# Patient Record
Sex: Female | Born: 1995 | Race: White | Hispanic: No | Marital: Single | Smoking: Current every day smoker
Health system: Southern US, Community
[De-identification: ages and names within clinical notes are randomized; demographics above are authoritative.]

---
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
## 2015-08-04 ENCOUNTER — Ambulatory Visit
Admission: EM | Admit: 2015-08-04 | Discharge: 2015-08-04 | Disposition: A | Discharge status: Home / Self Care | Payer: Self-pay | Attending: Emergency Medicine | Admitting: Emergency Medicine

## 2015-08-04 DIAGNOSIS — K0889 Other specified disorders of teeth and supporting structures: Secondary | ICD-10-CM

## 2015-08-04 DIAGNOSIS — K029 Dental caries, unspecified: Secondary | ICD-10-CM

## 2015-08-04 MED ORDER — HYDROCODONE-ACETAMINOPHEN 5-325 MG PO TABS: ORAL_TABLET | ORAL | 0 refills | Status: AC

## 2015-08-04 MED ORDER — PENICILLIN V POTASSIUM 500 MG PO TABS: 500.0000 mg | ORAL_TABLET | Freq: Four times a day (QID) | ORAL | 0 refills | Status: AC

## 2015-08-04 MED ORDER — IBUPROFEN 800 MG PO TABS
800.0000 mg | ORAL_TABLET | Freq: Three times a day (TID) | ORAL | 0 refills | Status: AC
Start: 2015-08-04 — End: ?

## 2015-08-04 MED ORDER — CHLORHEXIDINE GLUCONATE 0.12 % MT SOLN: OROMUCOSAL | 0 refills | Status: AC

## 2015-08-04 NOTE — ED Provider Notes (Signed)
HPI  SUBJECTIVE:  Tracey Lynch is a 20 y.o. female who presents with throbbing, sharp, constant right upper dental pain starting yesterday. She does not recall any trauma to the tooth. She has tried Excedrin with temporary improvement. Symptoms are worse with exposure to air and hot and cold liquids. She denies fevers, facial swelling, ear pain, trismus, foul taste in her mouth. Past medical history negative for diabetes, hyper tension. LMP: 3 days ago, denies possibility of being pregnant. Patient states that she has a plan to see a dentist that she seen before tomorrow.    History reviewed. No pertinent past medical history.  History reviewed. No pertinent surgical history.  History reviewed. No pertinent family history.  Social History  Substance Use Topics  . Smoking status: Current Some Day Smoker  . Smokeless tobacco: Never Used  . Alcohol use No    No current facility-administered medications for this encounter.   Current Outpatient Prescriptions:  .  norgestimate-ethinyl estradiol (ORTHO-CYCLEN,SPRINTEC,PREVIFEM) 0.25-35 MG-MCG tablet, Take 1 tablet by mouth daily., Disp: , Rfl:  .  chlorhexidine (PERIDEX) 0.12 % solution, 15 mL swish and spit bid, Disp: 480 mL, Rfl: 0 .  HYDROcodone-acetaminophen (NORCO/VICODIN) 5-325 MG tablet, 1-2 tabs q 6hr prn pain, Disp: 20 tablet, Rfl: 0 .  ibuprofen (ADVIL,MOTRIN) 800 MG tablet, Take 1 tablet (800 mg total) by mouth 3 (three) times daily., Disp: 30 tablet, Rfl: 0 .  penicillin v potassium (VEETID) 500 MG tablet, Take 1 tablet (500 mg total) by mouth 4 (four) times daily. X 10 days, Disp: 40 tablet, Rfl: 0  No Known Allergies   ROS  As noted in HPI.   Physical Exam  BP 112/85 (BP Location: Left Arm)   Pulse 79   Temp 98.6 F (37 C) (Oral)   Resp 20   Ht 5\' 5"  (1.651 m)   Wt 185 lb (83.9 kg)   LMP 07/29/2015   BMI 30.79 kg/m   Constitutional: Well developed, well nourished, no acute distress Eyes:  EOMI,  conjunctiva normal bilaterally HENT: Normocephalic, atraumatic,mucus membranes moist. Tooth #3, right upper first molar extensively decayed, tender to palpation. Positive gingival tenderness, no gingival swelling. No expressible purulent drainage. No trismus. Neck: No neck stiffness Respiratory: Normal inspiratory effort Cardiovascular: Normal rate GI: nondistended skin: No rash, skin intact Musculoskeletal: no deformities Neurologic: Alert & oriented x 3, no focal neuro deficits Psychiatric: Speech and behavior appropriate   ED Course   Medications - No data to display  No orders of the defined types were placed in this encounter.   No results found for this or any previous visit (from the past 24 hour(s)). No results found.  ED Clinical Impression  Dental caries  Toothache   ED Assessment/Plan  [facility] narcotic database reviewed. No appendectomy prescriptions in the past 6 months.   Procedure note: Perform dental block with 0.5 cc of lidocaine with epinephrine 1% with complete relief of symptoms. Patient tolerated procedure well.  Patient to follow-up with dentist of choice tomorrow. Home with Norco, ibuprofen, penicillin, Peridex or Listerine. Patient to discontinue the Excedrin. Discussed  MDM, plan and followup with patient. Patient agrees with plan.   *This clinic note was created using Dragon dictation software. Therefore, there may be occasional mistakes despite careful proofreading.  ?   Domenick Gong, MD 08/04/15 [phone]

## 2016-05-29 ENCOUNTER — Encounter: Payer: Self-pay | Admitting: *Deleted

## 2016-05-29 ENCOUNTER — Ambulatory Visit
Admission: EM | Admit: 2016-05-29 | Discharge: 2016-05-29 | Disposition: A | Discharge status: Home / Self Care | Payer: 59 | Attending: Internal Medicine | Admitting: Internal Medicine

## 2016-05-29 DIAGNOSIS — N6011 Diffuse cystic mastopathy of right breast: Secondary | ICD-10-CM

## 2016-05-29 DIAGNOSIS — L539 Erythematous condition, unspecified: Secondary | ICD-10-CM | POA: Diagnosis not present

## 2016-05-29 MED ORDER — CEPHALEXIN 500 MG PO CAPS: 500.0000 mg | ORAL_CAPSULE | Freq: Two times a day (BID) | ORAL | 0 refills | Status: AC

## 2016-05-29 NOTE — ED Triage Notes (Signed)
Patient noticed a red irritated spot on her right breast yesterday. Patient does have a history of breast cyst.

## 2016-05-29 NOTE — ED Provider Notes (Signed)
CSN: [id number]     Arrival date & time 05/29/16  1614 History   First MD Initiated Contact with Patient 05/29/16 1659     Chief Complaint  Patient presents with  . Skin Discoloration   (Consider location/radiation/quality/duration/timing/severity/associated sxs/prior Treatment) Patient is a 21 year old female who presents with complaint of a hard area of redness and pain to her right breast. Patient states her significant other noticed the redness yesterday but states that pain was noticeable this morning. Patient states she was told during a exam with the health department when she got her birth control pills that she had cystic breasts, which she states her mother also has. Patient states she has no knowledge of any family members with breast cancer. Patient states this pain redness is not typical for her menstrual breast pain. Patient states her last menstrual period was around 23rd-24th of April. Patient denies any discharge from the breast or from the area of redness. Patient states that she has had her nipple pierced for quite some time and has never had issues with that and doesn't believe this is associated with it. Patient has not taken any medications for this. Patient denies any fever or chills.      History reviewed. No pertinent past medical history. History reviewed. No pertinent surgical history. Family History  Problem Relation Age of Onset  . Cancer Mother   . Heart attack Father    Social History  Substance Use Topics  . Smoking status: Current Every Day Smoker    Packs/day: 0.50    Types: Cigarettes  . Smokeless tobacco: Never Used  . Alcohol use No   OB History    No data available     Review of Systems  Skin:       Painful area of erythema to right breast.  Chest: History of cystic breast  Allergies  Patient has no known allergies.  Home Medications   Prior to Admission medications   Medication Sig Start Date End Date Taking? Authorizing Provider    cephALEXin (KEFLEX) 500 MG capsule Take 1 capsule (500 mg total) by mouth 2 (two) times daily. 05/29/16 06/05/16  Candis Schatz, PA-C  chlorhexidine (PERIDEX) 0.12 % solution 15 mL swish and spit bid 08/04/15   Domenick Gong, MD  HYDROcodone-acetaminophen (NORCO/VICODIN) 5-325 MG tablet 1-2 tabs q 6hr prn pain 08/04/15   Domenick Gong, MD  ibuprofen (ADVIL,MOTRIN) 800 MG tablet Take 1 tablet (800 mg total) by mouth 3 (three) times daily. 08/04/15   Domenick Gong, MD  norgestimate-ethinyl estradiol (ORTHO-CYCLEN,SPRINTEC,PREVIFEM) 0.25-35 MG-MCG tablet Take 1 tablet by mouth daily.    [provider]  penicillin v potassium (VEETID) 500 MG tablet Take 1 tablet (500 mg total) by mouth 4 (four) times daily. X 10 days 08/04/15   Domenick Gong, MD   Meds Ordered and Administered this Visit  Medications - No data to display  BP 117/66 (BP Location: Left Arm)   Pulse 82   Temp 99 F (37.2 C) (Oral)   Resp 16   Ht 5\' 5"  (1.651 m)   Wt 180 lb (81.6 kg)   LMP 04/28/2016   SpO2 100%   BMI 29.95 kg/m  No data found.   Physical Exam  Constitutional: She is oriented to person, place, and time. She appears well-developed and well-nourished.  Exam done with Jacki Cones, RN present in room.  HENT:  Head: Normocephalic and atraumatic.  Eyes: Conjunctivae and EOM are normal. Pupils are equal, round, and reactive to light.  Neck: Normal range of motion. Neck supple.  Cardiovascular: Normal rate, regular rhythm and normal heart sounds.   Pulmonary/Chest: Effort normal and breath sounds normal. Right breast exhibits tenderness. Right breast exhibits no nipple discharge.    Abdominal: Soft. Bowel sounds are normal.  Musculoskeletal: Normal range of motion.  Neurological: She is alert and oriented to person, place, and time.    Urgent Care Course     Procedures (including critical care time)  Labs Review Labs Reviewed - No data to display  Imaging Review No results  found.    MDM   1. Fibrocystic disease of right breast   2. Skin erythema    Patient presents with complaint of area of pain and redness to the right breast. As above, there is a circular area of redness around the 2:00 position that is tender to touch. There is a nodule palpable underneath this area however it is difficult to tell if this is related abscess or a cyst but the patient is known to have. Patient states that she has not felt this before and states that her sister normally on the underside or along the outside of her breasts. Will go ahead and give the patient a short course of antibiotics with Keflex for possible infection. Patient states that she does not have a primary OB/GYN and [facility]. Gave patient contact information for [facility] and advised her to contact them today or tomorrow to get a an appointment to be seen for further evaluation including possible ultrasound. Advised patient is difficult to ascertain the true cause, including at there are some breast cancers that do appear as skin inflammation and that this should be evaluated further. Patient verbalized understanding is in agreement with the plan.  Candis SchatzMichael D Chattie Greeson, PA-C     Candis SchatzHarris, Trinidy Masterson D, PA-C 05/29/16 1754

## 2016-05-29 NOTE — Discharge Instructions (Signed)
-  Keflex one tablet twice a day for 7 days -call primary OB/GYN for appointment for further evaluation and possible imaging -contact number given for [facility] [place]

## 2016-06-11 ENCOUNTER — Encounter: Payer: Self-pay | Admitting: Obstetrics and Gynecology

## 2016-06-11 ENCOUNTER — Ambulatory Visit (INDEPENDENT_AMBULATORY_CARE_PROVIDER_SITE_OTHER): Payer: 59 | Admitting: Obstetrics and Gynecology

## 2016-06-11 VITALS — BP 118/74 | Ht 65.0 in | Wt 191.0 lb

## 2016-06-11 DIAGNOSIS — N61 Mastitis without abscess: Secondary | ICD-10-CM | POA: Diagnosis not present

## 2016-06-11 DIAGNOSIS — N6310 Unspecified lump in the right breast, unspecified quadrant: Secondary | ICD-10-CM

## 2016-06-11 NOTE — Progress Notes (Signed)
   Chief Complaint  Patient presents with  . Breast Pain    pt found lump in her breast/red and sore     HPI:      [name] is a 21 y.o. G0P0000 who LMP was Patient's last menstrual period was 06/04/2016., presents today as NP for breast symptoms. She complains of breast lump, breast tenderness and skin color change. Sx started 2 week(s) ago. She initially noticed a red spot on her breast and then a mass developed in the same area a couple days later. The mass was painful and hot, and pt went to urgent care. She was put on keflex and the redness, heat, and tenderness resolved. She still notes a breast mass in the area though and is concerned. She has a hx of fibrocystic breasts and drinks 1 caffeinated beverage daily. There is no FH of breast/ovar ca. She is not on any BC/hormones. She had her annual at [facility].  History reviewed. No pertinent past medical history.  History reviewed. No pertinent surgical history.  Family History  Problem Relation Age of Onset  . Cancer Mother   . Heart attack Father     ROS:  Review of Systems  Constitutional: Negative for malaise/fatigue and weight loss.  Respiratory: Negative.   Cardiovascular: Negative.   Skin: Positive for rash. Negative for itching.  Neurological: Negative.   Breast ROS: positive for - new or changing breast lumps   Objective: BP 118/74   Ht 5\' 5"  (1.651 m)   Wt 191 lb (86.6 kg)   LMP 06/04/2016   BMI 31.78 kg/m    Physical Exam  Constitutional: She appears well-developed.  Pulmonary/Chest: Right breast exhibits mass. Right breast exhibits no nipple discharge, no skin change and no tenderness. Left breast exhibits no mass, no nipple discharge, no skin change and no tenderness.    RT BREAST 2:00 WITH 2.0 X 1.5 CM, FIRM, MOLBILE, NT MASS  Vitals reviewed.     Assessment/Plan:  Breast mass, right - 2:00 Pos. Check breast u/s. Will f/u with results. - Plan: US BREAST LTD UNI RIGHT INC  AXILLA  Mastitis - Resolved after abx at urgent care.   Orders Placed This Encounter  Procedures  . US BREAST LTD UNI RIGHT INC AXILLA    Standing Status:   Future    Standing Expiration Date:   06/11/2017    Order Specific Question:   Reason for Exam (SYMPTOM  OR DIAGNOSIS REQUIRED)    Answer:   Right breast mass 2:00    Order Specific Question:   Preferred imaging location?    Answer:   [facility]      F/U  Return if symptoms worsen or fail to improve.  Margarita Bobrowski B. Wandalee Klang, PA-C 06/11/2016 8:41 AM

## 2016-06-18 ENCOUNTER — Ambulatory Visit
Admission: RE | Admit: 2016-06-18 | Discharge: 2016-06-18 | Disposition: A | Discharge status: Home / Self Care | Payer: 59 | Source: Ambulatory Visit | Attending: Obstetrics and Gynecology | Admitting: Obstetrics and Gynecology

## 2016-06-18 DIAGNOSIS — N6310 Unspecified lump in the right breast, unspecified quadrant: Secondary | ICD-10-CM | POA: Diagnosis present

## 2016-06-22 ENCOUNTER — Telehealth: Payer: Self-pay | Admitting: Obstetrics and Gynecology

## 2016-06-22 NOTE — Telephone Encounter (Signed)
LM with neg mass on breast u/s. Area c/w fluid from mastitis. Sx should resolve. F/u if sx persist/worsen.

## 2017-07-14 ENCOUNTER — Emergency Department
Admission: EM | Admit: 2017-07-14 | Discharge: 2017-07-14 | Disposition: A | Discharge status: Home / Self Care | Payer: Medicaid Other | Attending: Emergency Medicine | Admitting: Emergency Medicine

## 2017-07-14 ENCOUNTER — Other Ambulatory Visit: Payer: Self-pay

## 2017-07-14 DIAGNOSIS — E860 Dehydration: Secondary | ICD-10-CM | POA: Insufficient documentation

## 2017-07-14 DIAGNOSIS — R1010 Upper abdominal pain, unspecified: Secondary | ICD-10-CM | POA: Diagnosis not present

## 2017-07-14 DIAGNOSIS — O9989 Other specified diseases and conditions complicating pregnancy, childbirth and the puerperium: Secondary | ICD-10-CM | POA: Insufficient documentation

## 2017-07-14 DIAGNOSIS — O219 Vomiting of pregnancy, unspecified: Secondary | ICD-10-CM | POA: Diagnosis present

## 2017-07-14 DIAGNOSIS — O210 Mild hyperemesis gravidarum: Secondary | ICD-10-CM

## 2017-07-14 DIAGNOSIS — O211 Hyperemesis gravidarum with metabolic disturbance: Secondary | ICD-10-CM | POA: Insufficient documentation

## 2017-07-14 LAB — CBC
HEMATOCRIT: 38.3 % (ref 35.0–47.0)
HEMOGLOBIN: 13.3 g/dL (ref 12.0–16.0)
MCH: 29.5 pg (ref 26.0–34.0)
MCHC: 34.7 g/dL (ref 32.0–36.0)
MCV: 85.1 fL (ref 80.0–100.0)
Platelets: 176 10*3/uL (ref 150–440)
RBC: 4.51 MIL/uL (ref 3.80–5.20)
RDW: 13 % (ref 11.5–14.5)
WBC: 5.9 10*3/uL (ref 3.6–11.0)

## 2017-07-14 LAB — URINALYSIS, COMPLETE (UACMP) WITH MICROSCOPIC
Bilirubin Urine: NEGATIVE
GLUCOSE, UA: NEGATIVE mg/dL
Hgb urine dipstick: NEGATIVE
Ketones, ur: 5 mg/dL — AB
Nitrite: NEGATIVE
PROTEIN: NEGATIVE mg/dL
Specific Gravity, Urine: 1.024 (ref 1.005–1.030)
pH: 5 (ref 5.0–8.0)

## 2017-07-14 LAB — COMPREHENSIVE METABOLIC PANEL
ALBUMIN: 3.9 g/dL (ref 3.5–5.0)
ALT: 15 U/L (ref 0–44)
ANION GAP: 6 (ref 5–15)
AST: 18 U/L (ref 15–41)
Alkaline Phosphatase: 53 U/L (ref 38–126)
BILIRUBIN TOTAL: 0.6 mg/dL (ref 0.3–1.2)
BUN: 6 mg/dL (ref 6–20)
CO2: 22 mmol/L (ref 22–32)
Calcium: 8.7 mg/dL — ABNORMAL LOW (ref 8.9–10.3)
Chloride: 107 mmol/L (ref 98–111)
Creatinine, Ser: 0.54 mg/dL (ref 0.44–1.00)
GFR calc Af Amer: >60 mL/min (ref >60)
Glucose, Bld: 92 mg/dL (ref 70–99)
POTASSIUM: 3.7 mmol/L (ref 3.5–5.1)
Sodium: 135 mmol/L (ref 135–145)
TOTAL PROTEIN: 7.2 g/dL (ref 6.5–8.1)

## 2017-07-14 LAB — BETA-HYDROXYBUTYRIC ACID: Beta-Hydroxybutyric Acid: 0.13 mmol/L (ref 0.05–0.27)

## 2017-07-14 LAB — LIPASE, BLOOD: LIPASE: 22 U/L (ref 11–51)

## 2017-07-14 MED ORDER — SODIUM CHLORIDE 0.9 % IV BOLUS
1000.0000 mL | Freq: Once | INTRAVENOUS | Status: AC
  Administered 2017-07-14: 1000 mL via INTRAVENOUS

## 2017-07-14 MED ORDER — METOCLOPRAMIDE HCL 10 MG PO TABS: 10.0000 mg | ORAL_TABLET | Freq: Three times a day (TID) | ORAL | 0 refills | Status: AC | PRN

## 2017-07-14 MED ORDER — METOCLOPRAMIDE HCL 5 MG/ML IJ SOLN
10.0000 mg | Freq: Once | INTRAMUSCULAR | Status: AC
  Administered 2017-07-14: 10 mg via INTRAVENOUS

## 2017-07-14 MED ORDER — METOCLOPRAMIDE HCL 5 MG/ML IJ SOLN
INTRAMUSCULAR | Status: DC
Start: 2017-07-14 — End: 2017-07-14
  Filled 2017-07-14: qty 2

## 2017-07-14 MED ORDER — METOCLOPRAMIDE HCL 5 MG/ML IJ SOLN
20.0000 mg | Freq: Once | INTRAVENOUS | Status: DC
  Filled 2017-07-14: qty 4

## 2017-07-14 NOTE — ED Triage Notes (Signed)
Pt reports emesis all day X 2 days. Pt [redacted] weeks pregnant. Reports diarrhea as well. Pt alert and oriented X4, active, cooperative, pt in NAD. RR even and unlabored, color WNL.

## 2017-07-14 NOTE — ED Notes (Addendum)
Says vomiting and diarrhea for 2 days.  No fever. No sick family members.  Says she is not having pain except for hunger pains.  In nad.  Says she already has OB at [facility].

## 2017-07-14 NOTE — Discharge Instructions (Signed)
Please use Reglan up to 3 times a day as needed for nausea and follow-up with your OB gynecologist as needed.  Department sooner for any concerns whatsoever.  It was a pleasure to take care of you today, and thank you for coming to our emergency department.  If you have any questions or concerns before leaving please ask the nurse to grab me and I'm more than happy to go through your aftercare instructions again.  If you were prescribed any opioid pain medication today such as Norco, Vicodin, Percocet, morphine, hydrocodone, or oxycodone please make sure you do not drive when you are taking this medication as it can alter your ability to drive safely.  If you have any concerns once you are home that you are not improving or are in fact getting worse before you can make it to your follow-up appointment, please do not hesitate to call 911 and come back for further evaluation.  Merrily BrittleNeil Tung Pustejovsky, MD  Results for orders placed or performed during the hospital encounter of 07/14/17  Comprehensive metabolic panel  Result Value Ref Range   Sodium 135 135 - 145 mmol/L   Potassium 3.7 3.5 - 5.1 mmol/L   Chloride 107 98 - 111 mmol/L   CO2 22 22 - 32 mmol/L   Glucose, Bld 92 70 - 99 mg/dL   BUN 6 6 - 20 mg/dL   Creatinine, Ser 7.820.54 0.44 - 1.00 mg/dL   Calcium 8.7 (L) 8.9 - 10.3 mg/dL   Total Protein 7.2 6.5 - 8.1 g/dL   Albumin 3.9 3.5 - 5.0 g/dL   AST 18 15 - 41 U/L   ALT 15 0 - 44 U/L   Alkaline Phosphatase 53 38 - 126 U/L   Total Bilirubin 0.6 0.3 - 1.2 mg/dL   GFR calc non Af Amer >60 >60 mL/min   GFR calc Af Amer >60 >60 mL/min   Anion gap 6 5 - 15  CBC  Result Value Ref Range   WBC 5.9 3.6 - 11.0 K/uL   RBC 4.51 3.80 - 5.20 MIL/uL   Hemoglobin 13.3 12.0 - 16.0 g/dL   HCT 95.638.3 21.335.0 - 08.647.0 %   MCV 85.1 80.0 - 100.0 fL   MCH 29.5 26.0 - 34.0 pg   MCHC 34.7 32.0 - 36.0 g/dL   RDW 57.813.0 46.911.5 - 62.914.5 %   Platelets 176 150 - 440 K/uL  Urinalysis, Complete w Microscopic  Result Value Ref  Range   Color, Urine AMBER (A) YELLOW   APPearance HAZY (A) CLEAR   Specific Gravity, Urine 1.024 1.005 - 1.030   pH 5.0 5.0 - 8.0   Glucose, UA NEGATIVE NEGATIVE mg/dL   Hgb urine dipstick NEGATIVE NEGATIVE   Bilirubin Urine NEGATIVE NEGATIVE   Ketones, ur 5 (A) NEGATIVE mg/dL   Protein, ur NEGATIVE NEGATIVE mg/dL   Nitrite NEGATIVE NEGATIVE   Leukocytes, UA SMALL (A) NEGATIVE   RBC / HPF 0-5 0 - 5 RBC/hpf   WBC, UA 0-5 0 - 5 WBC/hpf   Bacteria, UA FEW (A) NONE SEEN   Squamous Epithelial / LPF 11-20 0 - 5   Mucus PRESENT   Lipase, blood  Result Value Ref Range   Lipase 22 11 - 51 U/L  Beta-hydroxybutyric acid  Result Value Ref Range   Beta-Hydroxybutyric Acid 0.13 0.05 - 0.27 mmol/L

## 2017-07-14 NOTE — ED Provider Notes (Signed)
[facility] Emergency Department Provider Note  ____________________________________________   First MD Initiated Contact with Patient 07/14/17 1556     (approximate)  I have reviewed the triage vital signs and the nursing notes.   HISTORY  Chief Complaint Emesis   HPI Tracey Lynch is a 22 y.o. female who self presents the emergency department with nausea vomiting and several loose stools for the past 2 days.  No fevers or chills.  She is in her first trimester of pregnancy.  This is her first pregnancy.  Her symptoms are worse when trying to eat or drink.  Somewhat improved when she is lying still with a cold rag on her head.  She does not have any antiemetics.  No sick contacts.  She does not think she is lost any weight.  She does have mild to moderate cramping upper abdominal pain nonradiating worse with vomiting improved after vomiting.    History reviewed. No pertinent past medical history.  There are no active problems to display for this patient.   History reviewed. No pertinent surgical history.  Prior to Admission medications   Medication Sig Start Date End Date Taking? Authorizing Provider  chlorhexidine (PERIDEX) 0.12 % solution 15 mL swish and spit bid Patient not taking: Reported on 06/11/2016 08/04/15   Domenick GongMortenson, Ashley, MD  HYDROcodone-acetaminophen (NORCO/VICODIN) 5-325 MG tablet 1-2 tabs q 6hr prn pain Patient not taking: Reported on 06/11/2016 08/04/15   Domenick GongMortenson, Ashley, MD  ibuprofen (ADVIL,MOTRIN) 800 MG tablet Take 1 tablet (800 mg total) by mouth 3 (three) times daily. Patient not taking: Reported on 06/11/2016 08/04/15   Domenick GongMortenson, Ashley, MD  metoCLOPramide (REGLAN) 10 MG tablet Take 1 tablet (10 mg total) by mouth 3 (three) times daily as needed for nausea. 07/14/17 07/14/18  Merrily Brittleifenbark, Verlinda Slotnick, MD  norgestimate-ethinyl estradiol (ORTHO-CYCLEN,SPRINTEC,PREVIFEM) 0.25-35 MG-MCG tablet Take 1 tablet by mouth daily.    [provider]  penicillin v potassium (VEETID) 500 MG tablet Take 1 tablet (500 mg total) by mouth 4 (four) times daily. X 10 days Patient not taking: Reported on 06/11/2016 08/04/15   Domenick GongMortenson, Ashley, MD    Allergies Patient has no known allergies.  Family History  Problem Relation Age of Onset  . Cancer Mother   . Heart attack Father     Social History Social History   Tobacco Use  . Smoking status: Current Every Day Smoker    Packs/day: 0.50    Types: Cigarettes  . Smokeless tobacco: Never Used  Substance Use Topics  . Alcohol use: No  . Drug use: No    Review of Systems Constitutional: No fever/chills Eyes: No visual changes. ENT: No sore throat. Cardiovascular: Denies chest pain. Respiratory: Denies shortness of breath. Gastrointestinal: Positive for abdominal pain.  Positive for nausea, positive for vomiting.  Positive for diarrhea.  No constipation. Genitourinary: Negative for dysuria. Musculoskeletal: Negative for back pain. Skin: Negative for rash. Neurological: Negative for headaches, focal weakness or numbness.   ____________________________________________   PHYSICAL EXAM:  VITAL SIGNS: ED Triage Vitals [07/14/17 1454]  Enc Vitals Group     BP 125/61     Pulse Rate 74     Resp 16     Temp 98.9 F (37.2 C)     Temp Source Oral     SpO2 100 %     Weight 191 lb (86.6 kg)     Height 5\' 4"  (1.626 m)     Head Circumference      Peak  Flow      Pain Score 0     Pain Loc      Pain Edu?      Excl. in GC?     Constitutional: Alert and oriented x4 appears obviously nauseated but nontoxic no diaphoresis Eyes: PERRL EOMI. Head: Atraumatic. Nose: No congestion/rhinnorhea. Mouth/Throat: No trismus Neck: No stridor.   Cardiovascular: Normal rate, regular rhythm. Grossly normal heart sounds.  Good peripheral circulation. Respiratory: Normal respiratory effort.  No retractions. Lungs CTAB and moving good air Gastrointestinal: Soft  nontender Musculoskeletal: No lower extremity edema   Neurologic:  Normal speech and language. No gross focal neurologic deficits are appreciated. Skin:  Skin is warm, dry and intact. No rash noted. Psychiatric: Mood and affect are normal. Speech and behavior are normal.    ____________________________________________   DIFFERENTIAL includes but not limited to  Morning sickness, hyperemesis, dehydration, gastroenteritis ____________________________________________   LABS (all labs ordered are listed, but only abnormal results are displayed)  Labs Reviewed  COMPREHENSIVE METABOLIC PANEL - Abnormal; Notable for the following components:      Result Value   Calcium 8.7 (*)    All other components within normal limits  URINALYSIS, COMPLETE (UACMP) WITH MICROSCOPIC - Abnormal; Notable for the following components:   Color, Urine AMBER (*)    APPearance HAZY (*)    Ketones, ur 5 (*)    Leukocytes, UA SMALL (*)    Bacteria, UA FEW (*)    All other components within normal limits  CBC  LIPASE, BLOOD  BETA-HYDROXYBUTYRIC ACID    Lab work reviewed by me with a small ketosis consistent with dehydration __________________________________________  EKG   ____________________________________________  RADIOLOGY   ____________________________________________   PROCEDURES  Procedure(s) performed: no  Procedures  Critical Care performed: no  ____________________________________________   INITIAL IMPRESSION / ASSESSMENT AND PLAN / ED COURSE  Pertinent labs & imaging results that were available during my care of the patient were reviewed by me and considered in my medical decision making (see chart for details).   The patient is obviously nauseated and vomiting in the setting of first trimester pregnancy.  I will give her 1 L of IV fluids and 20 mg of Reglan and then reevaluate.    ----------------------------------------- 4:35 PM on  07/14/2017 -----------------------------------------  After liter of fluid and 20 mg of Reglan the patient's symptoms are significantly improved.  She passed a p.o. challenge.  She has no antiemetics at home.  I will prescribe her a short course and refer her back to [facility] gynecology.  Strict return precautions have been given and the patient verbalizes understanding and agreement with the plan.  ____________________________________________   FINAL CLINICAL IMPRESSION(S) / ED DIAGNOSES  Final diagnoses:  Morning sickness  Dehydration      NEW MEDICATIONS STARTED DURING THIS VISIT:  Discharge Medication List as of 07/14/2017  4:34 PM    START taking these medications   Details  metoCLOPramide (REGLAN) 10 MG tablet Take 1 tablet (10 mg total) by mouth 3 (three) times daily as needed for nausea., Starting Wed 07/14/2017, Until Thu 07/14/2018, Print         Note:  This document was prepared using Dragon voice recognition software and may include unintentional dictation errors.     Merrily Brittle, MD 07/16/17 1357

## 2017-07-28 IMAGING — US US BREAST*R* LIMITED INC AXILLA
1 series · 5 of 5 positions shown · non-contrast
Comparison: None.

CLINICAL DATA: 20-year-old female with recent right breast
infection treated with antibiotics. Patient states that the clinical
symptoms have resolved.

EXAM:
ULTRASOUND OF THE RIGHT BREAST

[Series 1: us breast*right* limited inc axilla · 0.06mm/px · 5 of 5 slices shown]
[im 1/5]
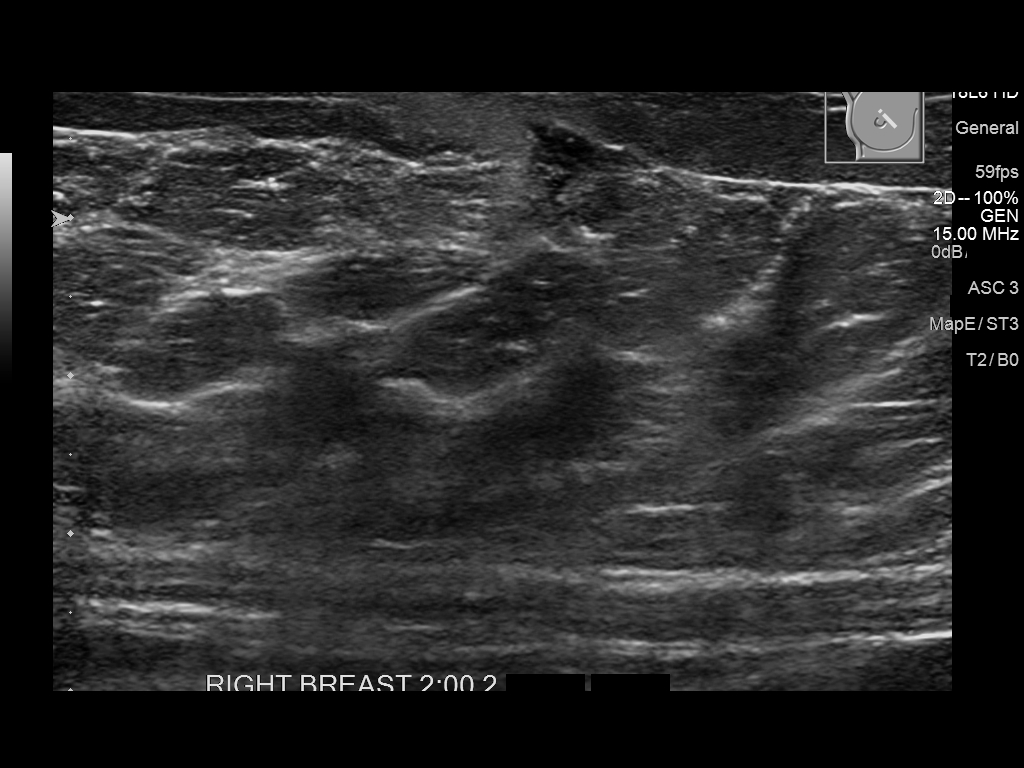
[im 2/5]
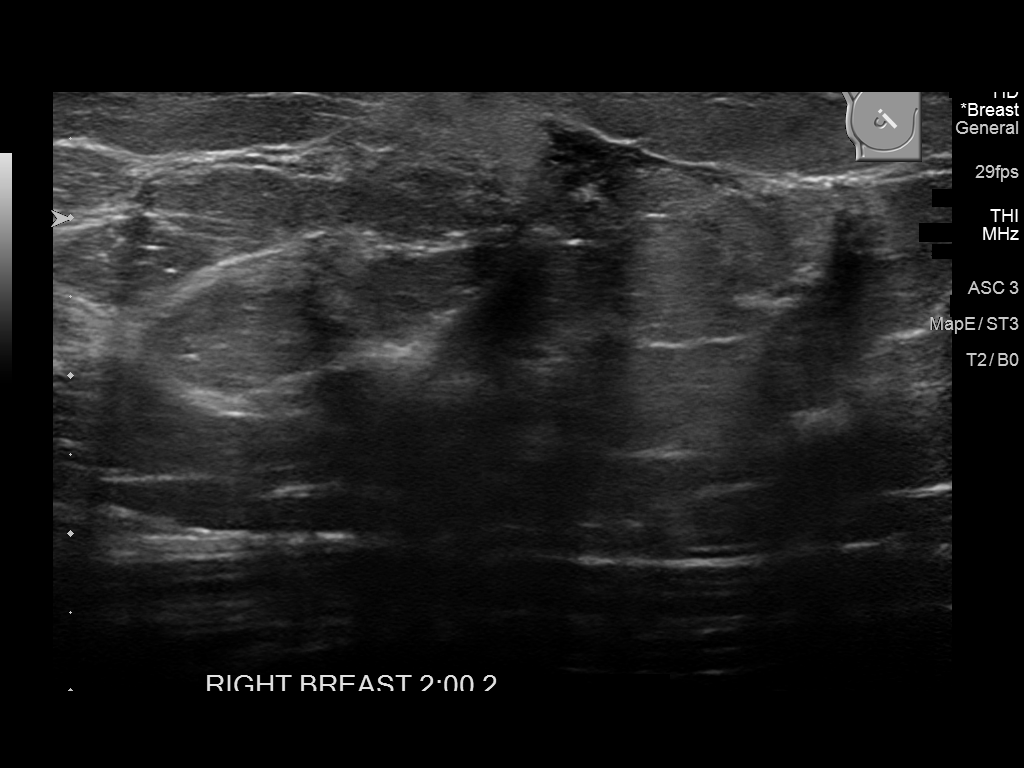
[im 3/5]
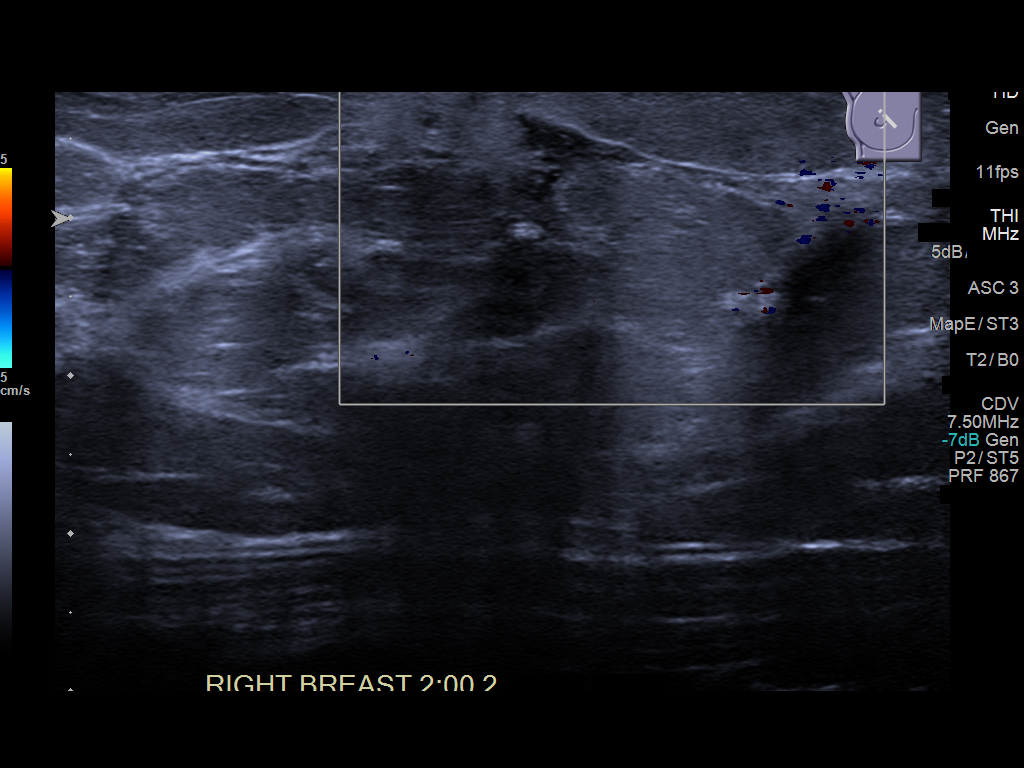
[im 4/5]
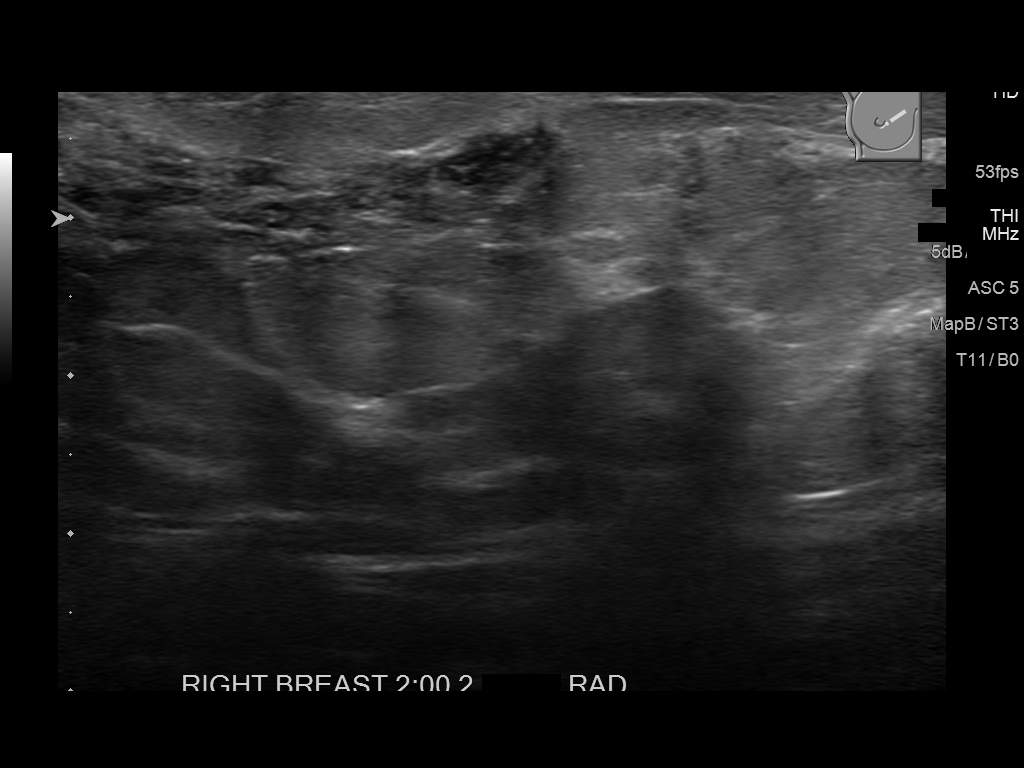
[im 5/5]
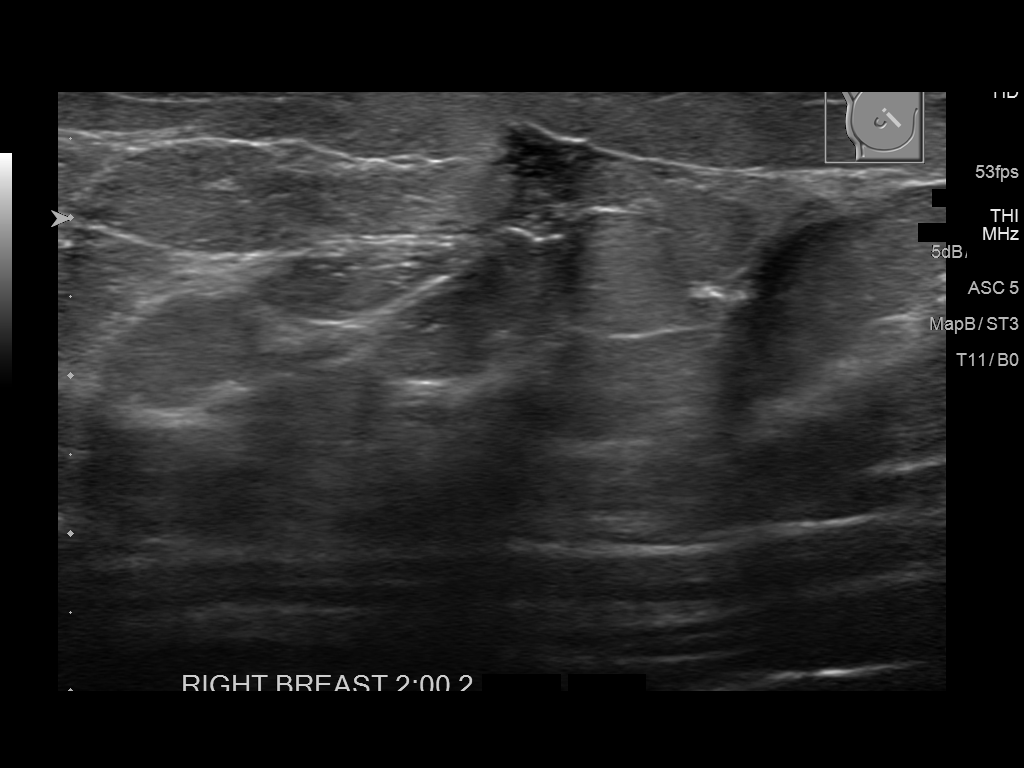

[5 of 5 positions shown; findings below may reference images not displayed]

FINDINGS: On physical exam, there is no palpable lump within the right breast.

Targeted ultrasound is performed, showing trace fluid/edema within
the right breast at the 2 o'clock axis, corresponding to the site of
patient's recent symptoms. No abscess collection. No suspicious
solid or cystic mass.
IMPRESSION: Trace fluid/edema within the superficial soft tissues of the right
breast at the 2 o'clock axis, 2 cm from the nipple, corresponding to
the site of patient's recent symptoms, presumably resolved
mastitis/infection, with clinical resolution per the patient status
post antibiotics.

RECOMMENDATION:
1. Clinical follow-up. Patient was instructed to follow-up with
referring physician if the clinical symptoms (palpable lump and
tenderness) recurred or if any subsequent erythema or other skin
changes were identified.
2. Screening mammogram at age 39 unless there are persistent or
intervening clinical concerns. (Code:[id number])

I have discussed the findings and recommendations with the patient.
Results were also provided in writing at the conclusion of the
visit. If applicable, a reminder letter will be sent to the patient
regarding the next appointment.

BI-RADS CATEGORY  2: Benign.

## 2017-08-03 ENCOUNTER — Other Ambulatory Visit: Payer: Self-pay

## 2017-08-03 ENCOUNTER — Encounter: Payer: Self-pay | Admitting: Emergency Medicine

## 2017-08-03 ENCOUNTER — Emergency Department
Admission: EM | Admit: 2017-08-03 | Discharge: 2017-08-03 | Disposition: A | Discharge status: Home / Self Care | Payer: Medicaid Other | Attending: Emergency Medicine | Admitting: Emergency Medicine

## 2017-08-03 DIAGNOSIS — Z79899 Other long term (current) drug therapy: Secondary | ICD-10-CM | POA: Insufficient documentation

## 2017-08-03 DIAGNOSIS — L02412 Cutaneous abscess of left axilla: Secondary | ICD-10-CM | POA: Diagnosis not present

## 2017-08-03 DIAGNOSIS — L0291 Cutaneous abscess, unspecified: Secondary | ICD-10-CM

## 2017-08-03 DIAGNOSIS — F17210 Nicotine dependence, cigarettes, uncomplicated: Secondary | ICD-10-CM | POA: Diagnosis not present

## 2017-08-03 MED ORDER — CEPHALEXIN 500 MG PO CAPS: 500.0000 mg | ORAL_CAPSULE | Freq: Three times a day (TID) | ORAL | 0 refills | Status: AC

## 2017-08-03 MED ORDER — LIDOCAINE HCL (PF) 1 % IJ SOLN
INTRAMUSCULAR | Status: AC
  Filled 2017-08-03: qty 5

## 2017-08-03 MED ORDER — LIDOCAINE HCL 1 % IJ SOLN
5.0000 mL | Freq: Once | INTRAMUSCULAR | Status: DC
  Filled 2017-08-03: qty 5

## 2017-08-03 MED ORDER — CEPHALEXIN 500 MG PO CAPS: 500.0000 mg | ORAL_CAPSULE | Freq: Three times a day (TID) | ORAL | 0 refills | Status: DC

## 2017-08-03 NOTE — ED Provider Notes (Signed)
[facility] Emergency Department Provider Note  ____________________________________________  Time seen: Approximately 11:54 PM  I have reviewed the triage vital signs and the nursing notes.   HISTORY  Chief Complaint Abscess    HPI Tracey Lynch is a 22 y.o. female presents to the emergency department with a left axillary abscess that has been apparent for the past 4 days.  Patient has tried warm compresses and self-expression.  She denies a history of cutaneous abscesses.  No fever or chills.  Patient is currently [redacted] weeks pregnant and denies abdominal pain or vaginal bleeding.   History reviewed. No pertinent past medical history.  There are no active problems to display for this patient.   History reviewed. No pertinent surgical history.  Prior to Admission medications   Medication Sig Start Date End Date Taking? Authorizing Provider  cephALEXin (KEFLEX) 500 MG capsule Take 1 capsule (500 mg total) by mouth 3 (three) times daily for 10 days. 08/03/17 08/13/17  Orvil Feil, PA-C  chlorhexidine (PERIDEX) 0.12 % solution 15 mL swish and spit bid Patient not taking: Reported on 06/11/2016 08/04/15   Domenick Gong, MD  HYDROcodone-acetaminophen (NORCO/VICODIN) 5-325 MG tablet 1-2 tabs q 6hr prn pain Patient not taking: Reported on 06/11/2016 08/04/15   Domenick Gong, MD  ibuprofen (ADVIL,MOTRIN) 800 MG tablet Take 1 tablet (800 mg total) by mouth 3 (three) times daily. Patient not taking: Reported on 06/11/2016 08/04/15   Domenick Gong, MD  metoCLOPramide (REGLAN) 10 MG tablet Take 1 tablet (10 mg total) by mouth 3 (three) times daily as needed for nausea. 07/14/17 07/14/18  Merrily Brittle, MD  norgestimate-ethinyl estradiol (ORTHO-CYCLEN,SPRINTEC,PREVIFEM) 0.25-35 MG-MCG tablet Take 1 tablet by mouth daily.    [provider]  penicillin v potassium (VEETID) 500 MG tablet Take 1 tablet (500 mg total) by mouth 4 (four) times daily. X 10  days Patient not taking: Reported on 06/11/2016 08/04/15   Domenick Gong, MD    Allergies Patient has no known allergies.  Family History  Problem Relation Age of Onset  . Cancer Mother   . Heart attack Father     Social History Social History   Tobacco Use  . Smoking status: Current Every Day Smoker    Packs/day: 0.50    Types: Cigarettes  . Smokeless tobacco: Never Used  Substance Use Topics  . Alcohol use: No  . Drug use: No     Review of Systems  Constitutional: No fever/chills Eyes: No visual changes. No discharge ENT: No upper respiratory complaints. Cardiovascular: no chest pain. Respiratory: no cough. No SOB. Gastrointestinal: No abdominal pain.  No nausea, no vomiting.  No diarrhea.  No constipation. Musculoskeletal: Negative for musculoskeletal pain. Skin: Patient has a left axillary abscess. Neurological: Negative for headaches, focal weakness or numbness.   ____________________________________________   PHYSICAL EXAM:  VITAL SIGNS: ED Triage Vitals  Enc Vitals Group     BP 08/03/17 2139 117/70     Pulse Rate 08/03/17 2139 80     Resp 08/03/17 2139 16     Temp 08/03/17 2139 98.2 F (36.8 C)     Temp Source 08/03/17 2139 Oral     SpO2 08/03/17 2139 99 %     Weight 08/03/17 2140 191 lb (86.6 kg)     Height 08/03/17 2140 5\' 4"  (1.626 m)     Head Circumference --      Peak Flow --      Pain Score 08/03/17 2144 8  Pain Loc --      Pain Edu? --      Excl. in GC? --      Constitutional: Alert and oriented. Well appearing and in no acute distress. Eyes: Conjunctivae are normal. PERRL. EOMI. Head: Atraumatic. Cardiovascular: Normal rate, regular rhythm. Normal S1 and S2.  Good peripheral circulation. Respiratory: Normal respiratory effort without tachypnea or retractions. Lungs CTAB. Good air entry to the bases with no decreased or absent breath sounds. Gastrointestinal: Bowel sounds 4 quadrants. Soft and nontender to palpation. No guarding  or rigidity. No palpable masses. No distention. No CVA tenderness. Musculoskeletal: Full range of motion to all extremities. No gross deformities appreciated. Neurologic:  Normal speech and language. No gross focal neurologic deficits are appreciated.  Skin: Patient has a 2 cm x 2 cm left axillary abscess with 3 cm of surrounding cellulitis. Psychiatric: Mood and affect are normal. Speech and behavior are normal. Patient exhibits appropriate insight and judgement.   ____________________________________________   LABS (all labs ordered are listed, but only abnormal results are displayed)  Labs Reviewed - No data to display ____________________________________________  EKG   ____________________________________________  RADIOLOGY   No results found.  ____________________________________________    PROCEDURES  Procedure(s) performed:    Procedures  INCISION AND DRAINAGE Performed by: Orvil FeilJaclyn M Deshannon Hinchliffe Consent: Verbal consent obtained. Risks and benefits: risks, benefits and alternatives were discussed Type: abscess  Body area: Left axilla   Anesthesia: local infiltration  Incision was made with a scalpel.  Local anesthetic: lidocaine 1% without epinephrine  Anesthetic total: 2 ml  Complexity: complex Blunt dissection to break up loculations  Drainage: purulent  Drainage amount: Copious   Packing material: 1/4 in iodoform gauze  Patient tolerance: Patient tolerated the procedure well with no immediate complications.     Medications  lidocaine (XYLOCAINE) 1 % (with pres) injection 5 mL (has no administration in time range)     ____________________________________________   INITIAL IMPRESSION / ASSESSMENT AND PLAN / ED COURSE  Pertinent labs & imaging results that were available during my care of the patient were reviewed by me and considered in my medical decision making (see chart for details).  Review of the [place] CSRS was performed in accordance  of the [facility] prior to dispensing any controlled drugs.      Assessment and plan Left axillary abscess Patient presents to the emergency department with a left axillary abscess.  Patient underwent incision and drainage in the emergency department without complication.  Patient was treated empirically with Keflex.  She was advised to remove packing after 24 hours.  Vital signs were reassuring prior to discharge.     ____________________________________________  FINAL CLINICAL IMPRESSION(S) / ED DIAGNOSES  Final diagnoses:  Abscess      NEW MEDICATIONS STARTED DURING THIS VISIT:  ED Discharge Orders        Ordered    cephALEXin (KEFLEX) 500 MG capsule  3 times daily,   Status:  Discontinued     08/03/17 2228    cephALEXin (KEFLEX) 500 MG capsule  3 times daily     08/03/17 2320          This chart was dictated using voice recognition software/Dragon. Despite best efforts to proofread, errors can occur which can change the meaning. Any change was purely unintentional.    Orvil FeilWoods, Ashton Belote M, PA-C 08/03/17 2359    Emily FilbertWilliams, Jonathan E, MD 08/04/17 [phone]

## 2017-08-03 NOTE — ED Triage Notes (Signed)
Patient ambulatory to triage with steady gait, without difficulty or distress noted; pt reports abscess to left axillae x 4 days; denies hx of same

## 2018-11-08 ENCOUNTER — Other Ambulatory Visit: Payer: Self-pay | Admitting: *Deleted

## 2018-11-08 DIAGNOSIS — Z20822 Contact with and (suspected) exposure to covid-19: Secondary | ICD-10-CM

## 2018-11-10 LAB — NOVEL CORONAVIRUS, NAA: SARS-CoV-2, NAA: NOT DETECTED
